# Patient Record
Sex: Female | Born: 1935 | Race: White | Hispanic: No | Marital: Married
Health system: Southern US, Community
[De-identification: ages and names within clinical notes are randomized; demographics above are authoritative.]

---

## 2004-06-29 ENCOUNTER — Ambulatory Visit: Payer: Self-pay | Admitting: Internal Medicine

## 2004-07-23 ENCOUNTER — Ambulatory Visit: Payer: Self-pay | Admitting: Internal Medicine

## 2004-09-22 ENCOUNTER — Ambulatory Visit: Payer: Self-pay | Admitting: Internal Medicine

## 2004-09-30 ENCOUNTER — Ambulatory Visit: Payer: Self-pay | Admitting: Internal Medicine

## 2004-10-21 ENCOUNTER — Ambulatory Visit: Payer: Self-pay | Admitting: Internal Medicine

## 2004-12-29 ENCOUNTER — Ambulatory Visit: Payer: Self-pay | Admitting: Internal Medicine

## 2005-01-04 ENCOUNTER — Ambulatory Visit: Payer: Self-pay | Admitting: Internal Medicine

## 2005-03-02 ENCOUNTER — Ambulatory Visit: Payer: Self-pay | Admitting: Ophthalmology

## 2005-03-08 ENCOUNTER — Ambulatory Visit: Payer: Self-pay | Admitting: Ophthalmology

## 2005-04-05 ENCOUNTER — Ambulatory Visit: Payer: Self-pay | Admitting: Internal Medicine

## 2005-04-12 ENCOUNTER — Ambulatory Visit: Payer: Self-pay | Admitting: Ophthalmology

## 2005-04-23 ENCOUNTER — Ambulatory Visit: Payer: Self-pay | Admitting: Internal Medicine

## 2005-08-04 ENCOUNTER — Ambulatory Visit: Payer: Self-pay | Admitting: Internal Medicine

## 2005-08-23 ENCOUNTER — Ambulatory Visit: Payer: Self-pay | Admitting: Internal Medicine

## 2005-10-14 ENCOUNTER — Ambulatory Visit: Payer: Self-pay | Admitting: Internal Medicine

## 2005-12-10 ENCOUNTER — Ambulatory Visit: Payer: Self-pay | Admitting: Internal Medicine

## 2005-12-21 ENCOUNTER — Ambulatory Visit: Payer: Self-pay | Admitting: Internal Medicine

## 2006-03-01 ENCOUNTER — Other Ambulatory Visit: Payer: Self-pay

## 2006-03-01 ENCOUNTER — Emergency Department: Payer: Self-pay | Admitting: Emergency Medicine

## 2006-04-08 ENCOUNTER — Ambulatory Visit: Payer: Self-pay | Admitting: Internal Medicine

## 2006-04-23 ENCOUNTER — Ambulatory Visit: Payer: Self-pay | Admitting: Internal Medicine

## 2006-10-07 ENCOUNTER — Ambulatory Visit: Payer: Self-pay | Admitting: Internal Medicine

## 2006-10-22 ENCOUNTER — Ambulatory Visit: Payer: Self-pay | Admitting: Internal Medicine

## 2006-11-29 ENCOUNTER — Ambulatory Visit: Payer: Self-pay | Admitting: Internal Medicine

## 2007-02-21 ENCOUNTER — Ambulatory Visit: Payer: Self-pay | Admitting: Internal Medicine

## 2007-03-10 ENCOUNTER — Ambulatory Visit: Payer: Self-pay | Admitting: Internal Medicine

## 2007-03-24 ENCOUNTER — Ambulatory Visit: Payer: Self-pay | Admitting: Internal Medicine

## 2007-07-06 ENCOUNTER — Ambulatory Visit: Payer: Self-pay | Admitting: Internal Medicine

## 2007-07-24 ENCOUNTER — Ambulatory Visit: Payer: Self-pay | Admitting: Internal Medicine

## 2007-10-22 ENCOUNTER — Ambulatory Visit: Payer: Self-pay | Admitting: Internal Medicine

## 2007-11-07 ENCOUNTER — Ambulatory Visit: Payer: Self-pay | Admitting: Internal Medicine

## 2007-11-22 ENCOUNTER — Ambulatory Visit: Payer: Self-pay | Admitting: Internal Medicine

## 2007-12-13 ENCOUNTER — Ambulatory Visit: Payer: Self-pay | Admitting: Internal Medicine

## 2008-03-13 ENCOUNTER — Emergency Department: Payer: Self-pay | Admitting: Emergency Medicine

## 2008-03-15 ENCOUNTER — Ambulatory Visit: Payer: Self-pay | Admitting: Emergency Medicine

## 2008-03-21 ENCOUNTER — Emergency Department: Payer: Self-pay

## 2008-05-06 ENCOUNTER — Ambulatory Visit: Payer: Self-pay | Admitting: Internal Medicine

## 2008-05-23 ENCOUNTER — Ambulatory Visit: Payer: Self-pay | Admitting: Internal Medicine

## 2008-09-10 ENCOUNTER — Ambulatory Visit: Payer: Self-pay | Admitting: Internal Medicine

## 2008-10-21 ENCOUNTER — Ambulatory Visit: Payer: Self-pay | Admitting: Internal Medicine

## 2008-11-04 ENCOUNTER — Ambulatory Visit: Payer: Self-pay | Admitting: Internal Medicine

## 2008-11-21 ENCOUNTER — Ambulatory Visit: Payer: Self-pay | Admitting: Internal Medicine

## 2009-02-05 ENCOUNTER — Ambulatory Visit: Payer: Self-pay | Admitting: Internal Medicine

## 2009-05-05 ENCOUNTER — Ambulatory Visit: Payer: Self-pay | Admitting: Internal Medicine

## 2009-05-23 ENCOUNTER — Ambulatory Visit: Payer: Self-pay | Admitting: Internal Medicine

## 2009-10-01 ENCOUNTER — Ambulatory Visit: Payer: Self-pay | Admitting: Anesthesiology

## 2009-10-08 ENCOUNTER — Ambulatory Visit: Payer: Self-pay | Admitting: Internal Medicine

## 2009-10-21 ENCOUNTER — Ambulatory Visit: Payer: Self-pay | Admitting: Internal Medicine

## 2009-10-22 ENCOUNTER — Ambulatory Visit: Payer: Self-pay | Admitting: Anesthesiology

## 2009-11-03 ENCOUNTER — Ambulatory Visit: Payer: Self-pay | Admitting: Internal Medicine

## 2009-11-21 ENCOUNTER — Ambulatory Visit: Payer: Self-pay | Admitting: Internal Medicine

## 2009-11-25 ENCOUNTER — Ambulatory Visit: Payer: Self-pay | Admitting: Anesthesiology

## 2009-12-24 ENCOUNTER — Ambulatory Visit: Payer: Self-pay | Admitting: Anesthesiology

## 2010-01-21 ENCOUNTER — Ambulatory Visit: Payer: Self-pay | Admitting: Internal Medicine

## 2010-02-03 ENCOUNTER — Ambulatory Visit: Payer: Self-pay | Admitting: Internal Medicine

## 2010-02-10 ENCOUNTER — Ambulatory Visit: Payer: Self-pay | Admitting: Internal Medicine

## 2010-02-19 ENCOUNTER — Ambulatory Visit: Payer: Self-pay | Admitting: Anesthesiology

## 2010-02-20 ENCOUNTER — Ambulatory Visit: Payer: Self-pay | Admitting: Internal Medicine

## 2010-04-17 ENCOUNTER — Ambulatory Visit: Payer: Self-pay | Admitting: Anesthesiology

## 2010-08-05 ENCOUNTER — Ambulatory Visit: Payer: Self-pay | Admitting: Internal Medicine

## 2010-08-07 LAB — CEA: CEA: 5.3 ng/mL — ABNORMAL HIGH (ref 0.0–4.7)

## 2010-08-23 ENCOUNTER — Ambulatory Visit: Payer: Self-pay | Admitting: Internal Medicine

## 2010-09-11 ENCOUNTER — Ambulatory Visit: Payer: Self-pay | Admitting: Anesthesiology

## 2011-02-03 ENCOUNTER — Ambulatory Visit: Payer: Self-pay | Admitting: Internal Medicine

## 2011-02-04 LAB — CEA: CEA: 4.3 ng/mL (ref 0.0–4.7)

## 2011-02-21 ENCOUNTER — Ambulatory Visit: Payer: Self-pay | Admitting: Internal Medicine

## 2011-03-09 ENCOUNTER — Ambulatory Visit: Payer: Self-pay | Admitting: Internal Medicine

## 2012-02-27 ENCOUNTER — Emergency Department: Payer: Self-pay | Admitting: *Deleted

## 2012-02-27 LAB — COMPREHENSIVE METABOLIC PANEL WITH GFR
Albumin: 4 g/dL
Alkaline Phosphatase: 82 U/L
Anion Gap: 9
BUN: 17 mg/dL
Bilirubin,Total: 0.4 mg/dL
Calcium, Total: 8.9 mg/dL
Chloride: 102 mmol/L
Co2: 28 mmol/L
Creatinine: 0.7 mg/dL
EGFR (African American): >60
EGFR (Non-African Amer.): >60
Glucose: 100 mg/dL — ABNORMAL HIGH
Osmolality: 279
Potassium: 3.4 mmol/L — ABNORMAL LOW
SGOT(AST): 25 U/L
SGPT (ALT): 16 U/L
Sodium: 139 mmol/L
Total Protein: 8.2 g/dL

## 2012-02-27 LAB — TROPONIN I: Troponin-I: 0.02 ng/mL

## 2012-02-27 LAB — CBC
HCT: 39.8 %
HGB: 13.5 g/dL
MCH: 33.6 pg
MCHC: 33.8 g/dL
MCV: 99 fL
Platelet: 189 x10 3/mm 3
RBC: 4.01 X10 6/mm 3
RDW: 12 %
WBC: 8.3 x10 3/mm 3

## 2012-02-27 LAB — URINALYSIS, COMPLETE
Blood: NEGATIVE
Glucose,UR: NEGATIVE mg/dL (ref 0–75)
Nitrite: NEGATIVE
Ph: 7 (ref 4.5–8.0)
Protein: NEGATIVE
RBC,UR: 11 /HPF (ref 0–5)
Specific Gravity: 1.018 (ref 1.003–1.030)
Squamous Epithelial: NONE SEEN

## 2012-02-27 LAB — CK TOTAL AND CKMB (NOT AT ARMC): CK, Total: 71 U/L (ref 21–215)

## 2012-10-25 ENCOUNTER — Emergency Department: Payer: Self-pay | Admitting: Emergency Medicine

## 2012-10-25 LAB — CBC WITH DIFFERENTIAL/PLATELET
Eosinophil #: 0.4 10*3/uL (ref 0.0–0.7)
Eosinophil %: 3.8 %
HCT: 37 % (ref 35.0–47.0)
Lymphocyte %: 11.9 %
MCH: 32.8 pg (ref 26.0–34.0)
MCHC: 33.3 g/dL (ref 32.0–36.0)
MCV: 98 fL (ref 80–100)
Monocyte #: 1.2 x10 3/mm — ABNORMAL HIGH (ref 0.2–0.9)
Monocyte %: 11.2 %
Neutrophil #: 7.6 10*3/uL — ABNORMAL HIGH (ref 1.4–6.5)
Neutrophil %: 72.8 %
Platelet: 180 10*3/uL (ref 150–440)
RBC: 3.76 10*6/uL — ABNORMAL LOW (ref 3.80–5.20)
RDW: 12.5 % (ref 11.5–14.5)
WBC: 10.5 10*3/uL (ref 3.6–11.0)

## 2012-10-25 LAB — URINALYSIS, COMPLETE
Bilirubin,UR: NEGATIVE
Blood: NEGATIVE
Ketone: NEGATIVE
Nitrite: NEGATIVE
Ph: 6 (ref 4.5–8.0)
Protein: NEGATIVE
RBC,UR: 1 /HPF (ref 0–5)
Specific Gravity: 1.015 (ref 1.003–1.030)
Squamous Epithelial: <1
WBC UR: NONE SEEN /HPF (ref 0–5)

## 2012-10-25 LAB — COMPREHENSIVE METABOLIC PANEL
Albumin: 3.3 g/dL — ABNORMAL LOW (ref 3.4–5.0)
BUN: 22 mg/dL — ABNORMAL HIGH (ref 7–18)
Bilirubin,Total: 0.4 mg/dL (ref 0.2–1.0)
Calcium, Total: 8.5 mg/dL (ref 8.5–10.1)
Co2: 33 mmol/L — ABNORMAL HIGH (ref 21–32)
Creatinine: 0.76 mg/dL (ref 0.60–1.30)
EGFR (African American): >60
Osmolality: 278 (ref 275–301)
Potassium: 4.1 mmol/L (ref 3.5–5.1)
SGOT(AST): 17 U/L (ref 15–37)
SGPT (ALT): 16 U/L (ref 12–78)
Total Protein: 7.6 g/dL (ref 6.4–8.2)

## 2013-02-20 ENCOUNTER — Ambulatory Visit: Payer: Self-pay | Admitting: Internal Medicine

## 2013-02-20 ENCOUNTER — Inpatient Hospital Stay: Payer: Self-pay | Admitting: Family Medicine

## 2013-02-20 LAB — COMPREHENSIVE METABOLIC PANEL
Albumin: 3.5 g/dL (ref 3.4–5.0)
Alkaline Phosphatase: 141 U/L — ABNORMAL HIGH (ref 50–136)
BUN: 18 mg/dL (ref 7–18)
Creatinine: 0.75 mg/dL (ref 0.60–1.30)
Glucose: 181 mg/dL — ABNORMAL HIGH (ref 65–99)
Osmolality: 278 (ref 275–301)
SGOT(AST): 41 U/L — ABNORMAL HIGH (ref 15–37)
SGPT (ALT): 26 U/L (ref 12–78)
Sodium: 136 mmol/L (ref 136–145)

## 2013-02-20 LAB — CBC WITH DIFFERENTIAL/PLATELET
Basophil #: 0.1 10*3/uL (ref 0.0–0.1)
Basophil %: 0.2 %
Eosinophil #: 0.6 10*3/uL (ref 0.0–0.7)
Eosinophil %: 2.7 %
Lymphocyte %: 45.9 %
MCH: 29.6 pg (ref 26.0–34.0)
Monocyte #: 1.4 x10 3/mm — ABNORMAL HIGH (ref 0.2–0.9)
Neutrophil #: 9.7 10*3/uL — ABNORMAL HIGH (ref 1.4–6.5)
Platelet: 233 10*3/uL (ref 150–440)
RBC: 4.54 10*6/uL (ref 3.80–5.20)
RDW: 16.1 % — ABNORMAL HIGH (ref 11.5–14.5)

## 2013-02-20 LAB — TSH: Thyroid Stimulating Horm: 3.78 u[IU]/mL

## 2013-03-23 ENCOUNTER — Ambulatory Visit: Payer: Self-pay | Admitting: Internal Medicine

## 2013-03-23 DEATH — deceased

## 2013-07-22 IMAGING — CT CT HEAD WITHOUT CONTRAST
2 series · 15 of 30 positions shown, 19 images · non-contrast
Comparison: none

REASON FOR EXAM: AMS
COMMENTS:

PROCEDURE:     CT  - CT HEAD WITHOUT CONTRAST  - February 27, 2012  [DATE]
RESULT:     Comparison:  03/13/2008
TECHNIQUE: Multiple axial images from the foramen magnum to the vertex were
obtained without IV contrast.

[Series 2: without · axial · non-contrast · 0.44mm/px · z∈[-15,+120]mm · 13 of 33 slices shown, 17 images]
[im 3/33  brain]
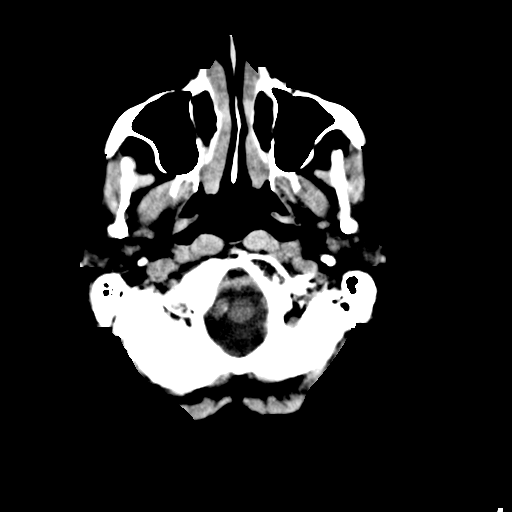
[im 3/33  bone]
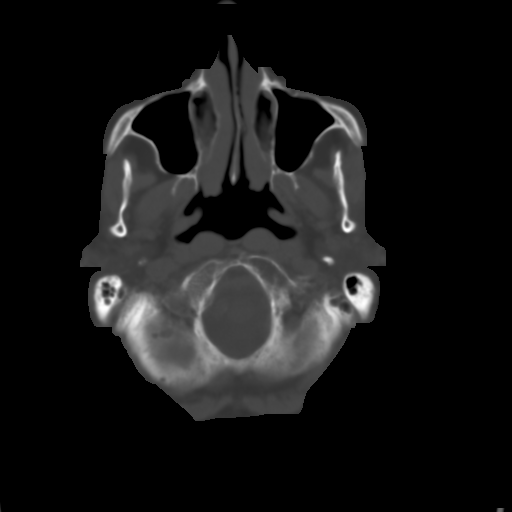
[im 5/33  brain]
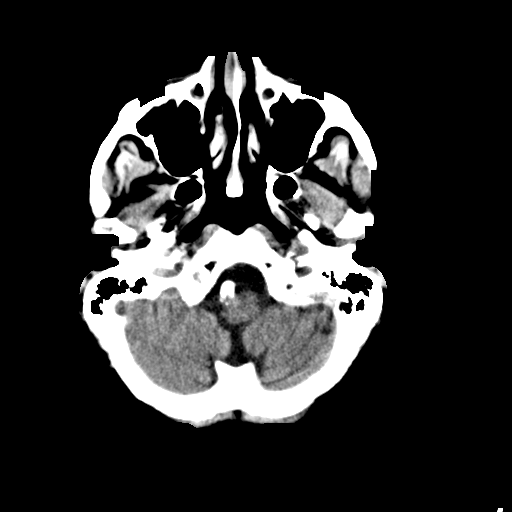
[im 7/33  brain]
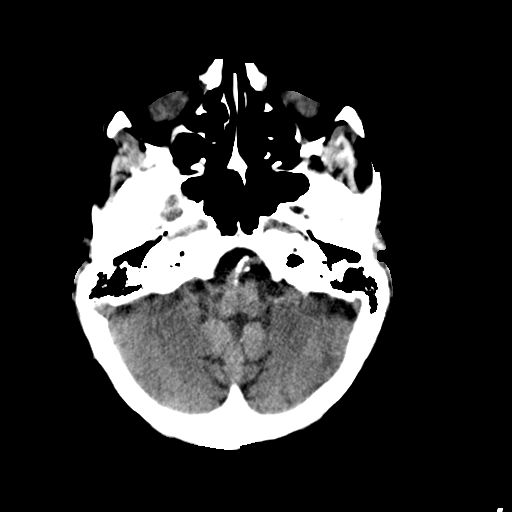
[im 10/33  brain]
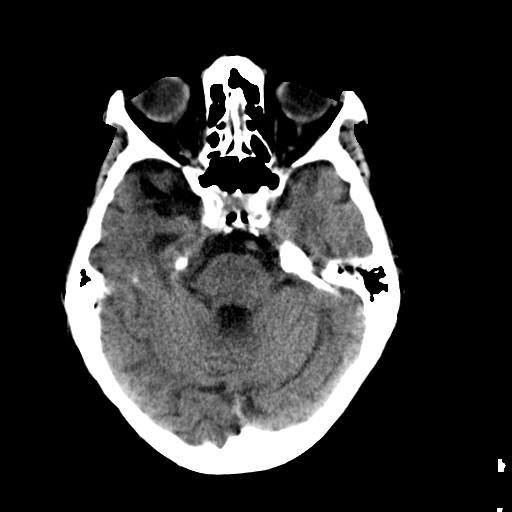
[im 12/33  brain]
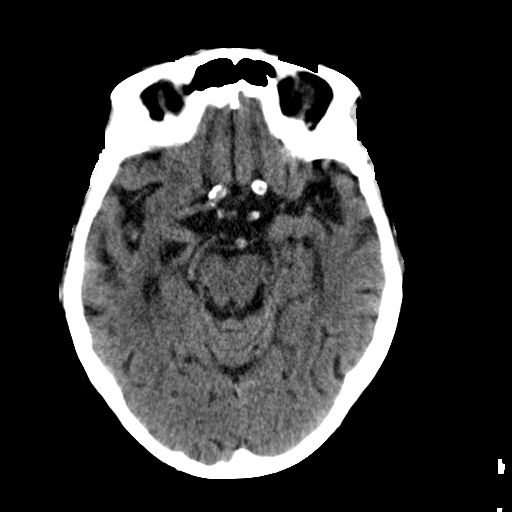
[im 12/33  bone]
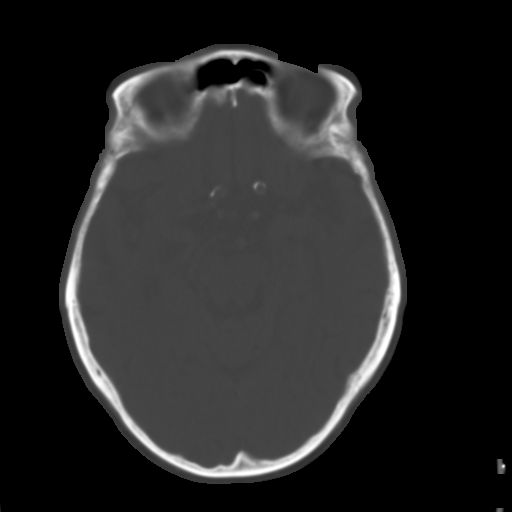
[im 14/33  brain]
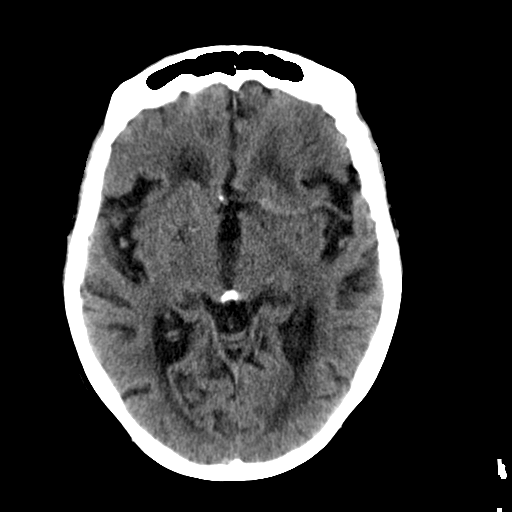
[im 17/33  brain]
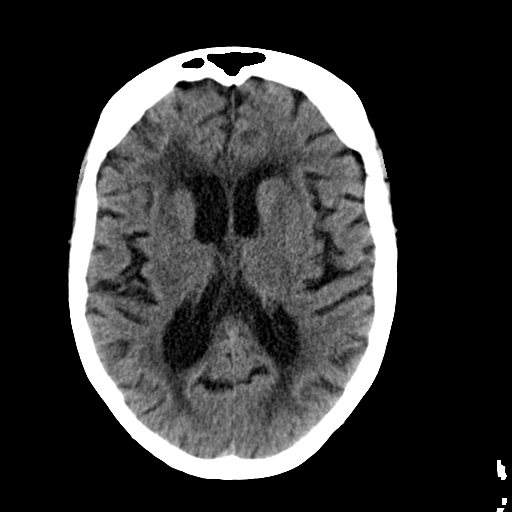
[im 19/33  brain]
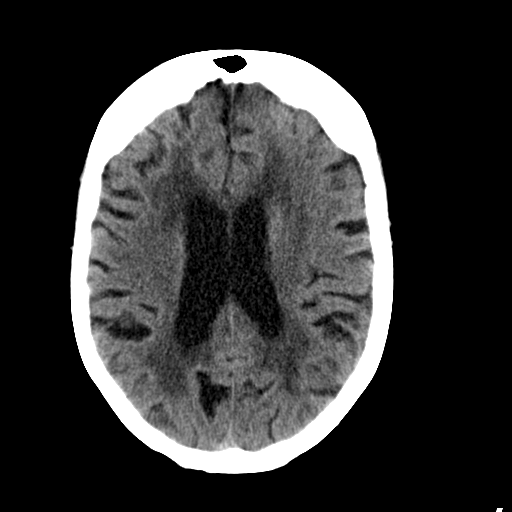
[im 21/33  brain]
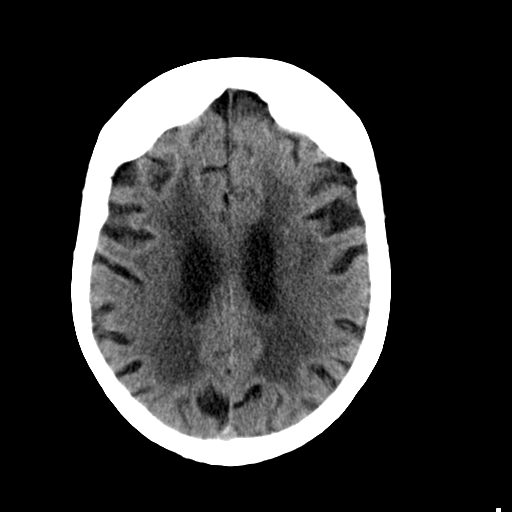
[im 21/33  bone]
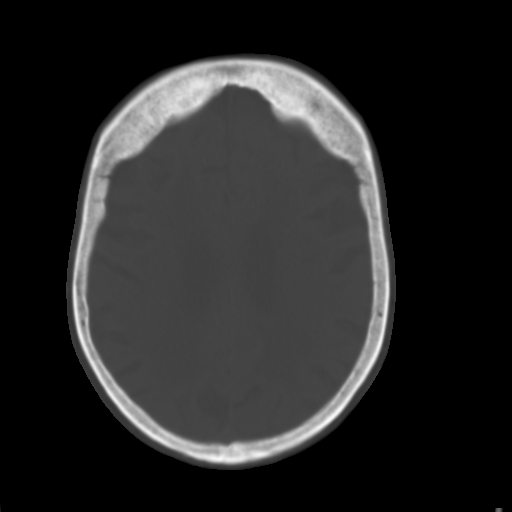
[im 23/33  brain]
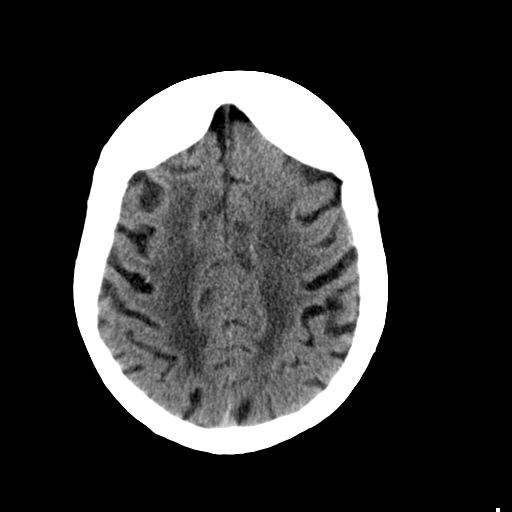
[im 26/33  brain]
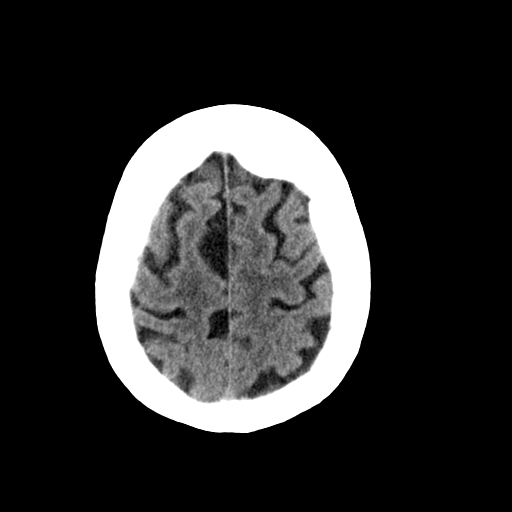
[im 28/33  brain]
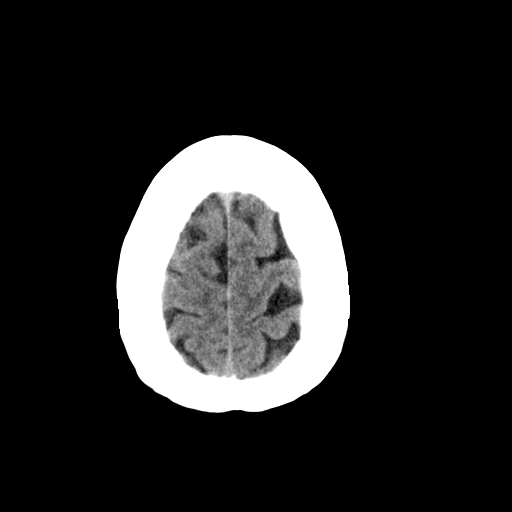
[im 30/33  brain]
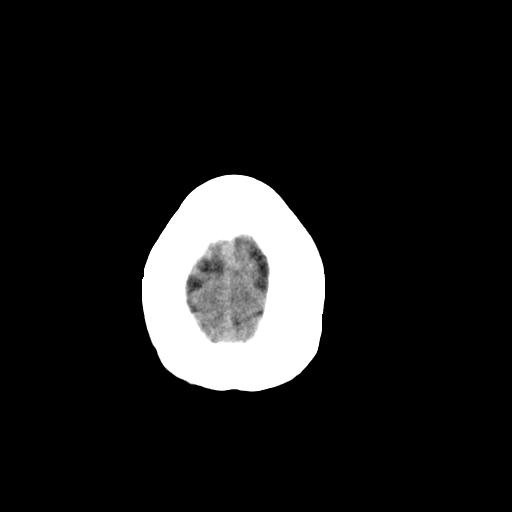
[im 30/33  bone]
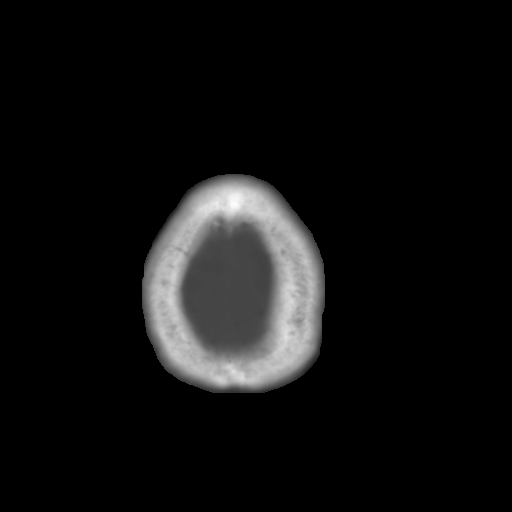

[Series 3: bone · axial · 0.44mm/px · z∈[-15,+5]mm · 2 of 33 slices shown]
[im 3/33  bone]
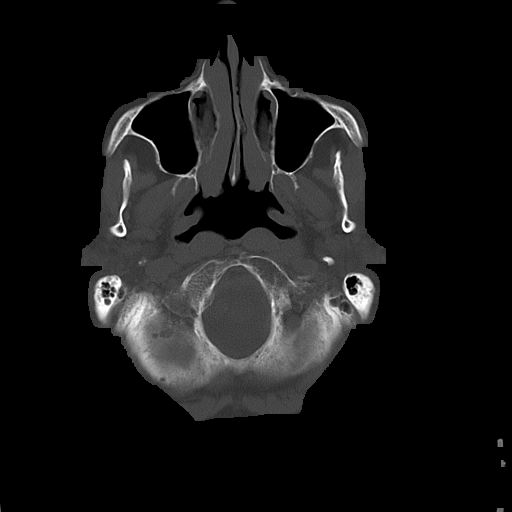
[im 7/33  bone]
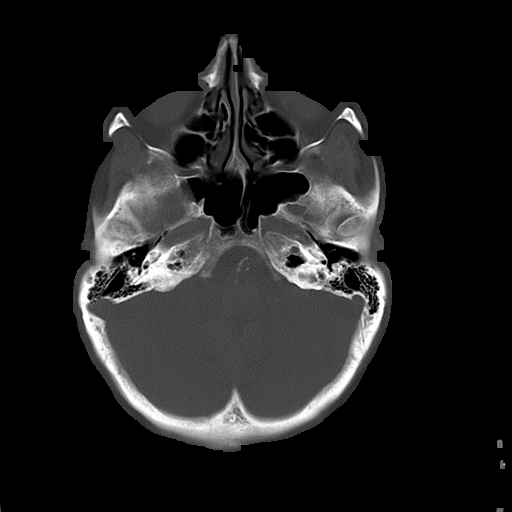

[15 of 30 positions shown; findings below may reference images not displayed]

FINDINGS: There is no evidence of mass effect, midline shift, or extra-axial fluid
collections.  There is no evidence of a space-occupying lesion or
intracranial hemorrhage. There is no evidence of a cortical-based area of
acute infarction. There is an old left thalamic lacunar infarct. There is
generalized cerebral atrophy. There is periventricular white matter low
attenuation likely secondary to microangiopathy.

The ventricles and sulci are appropriate for the patient's age. The basal
cisterns are patent.

Visualized portions of the orbits are unremarkable. The visualized portions
of the paranasal sinuses and mastoid air cells are unremarkable.
Cerebrovascular atherosclerotic calcifications are noted.

The osseous structures are unremarkable.
IMPRESSION: No acute intracranial process.

[REDACTED]

## 2014-03-20 IMAGING — CR DG KNEE COMPLETE 4+V*L*
1 series · 4 of 4 positions shown · non-contrast
Comparison: none

REASON FOR EXAM: fall with left knee pain, deformity
COMMENTS:

PROCEDURE:     DXR - DXR KNEE LT COMP WITH OBLIQUES  - October 25, 2012  [DATE]
RESULT:     Comparison:  None

[Series 3: x knee ap left · 0.14mm/px · 4 of 4 slices shown]
[im 1/4]
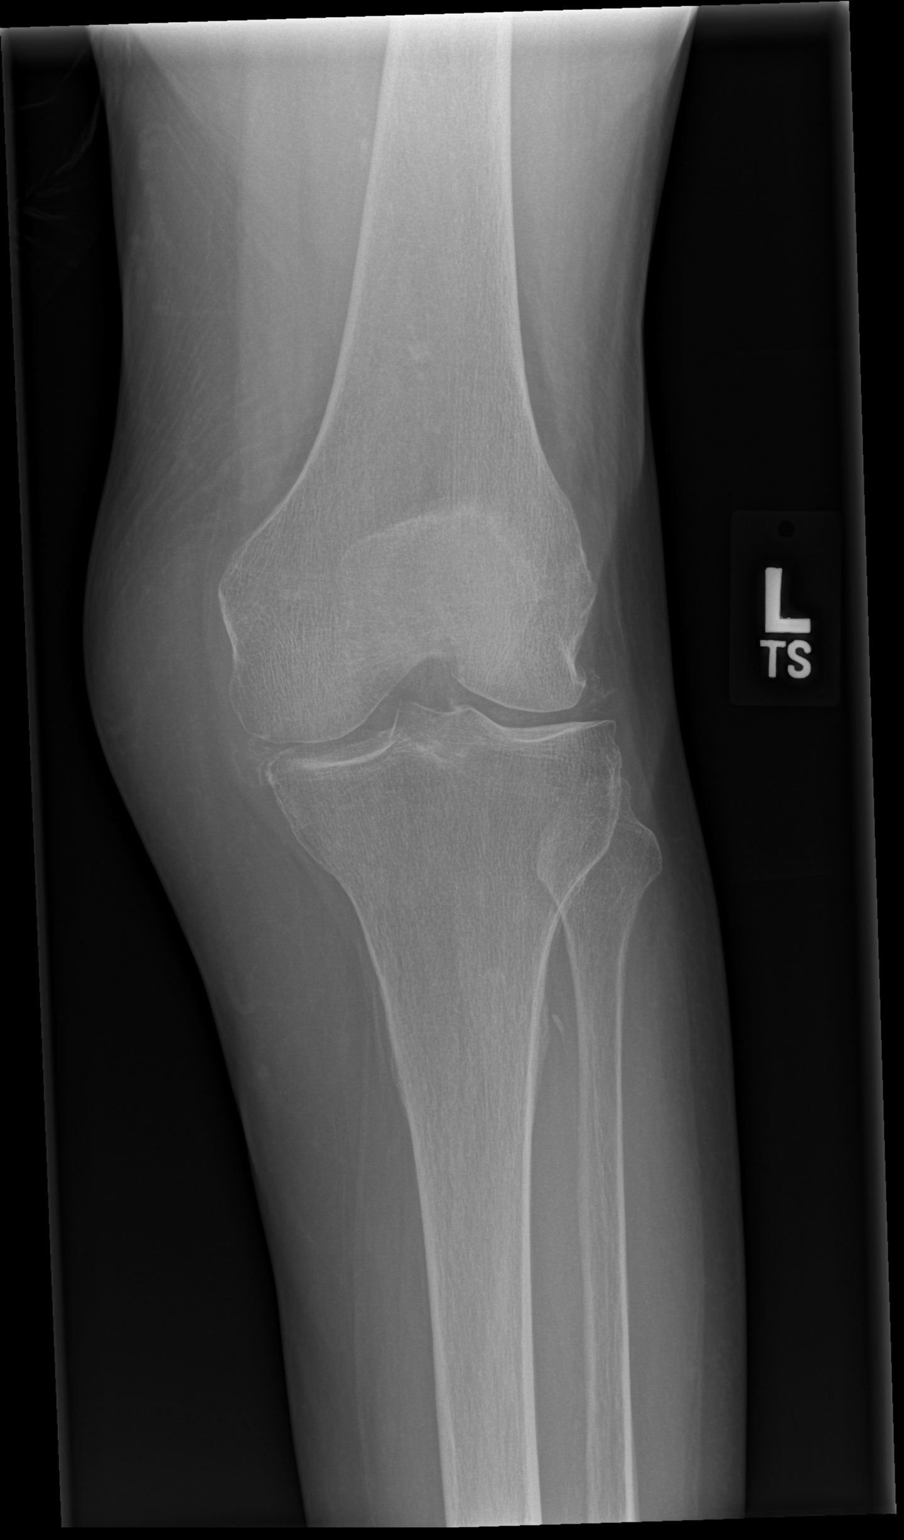
[im 2/4]
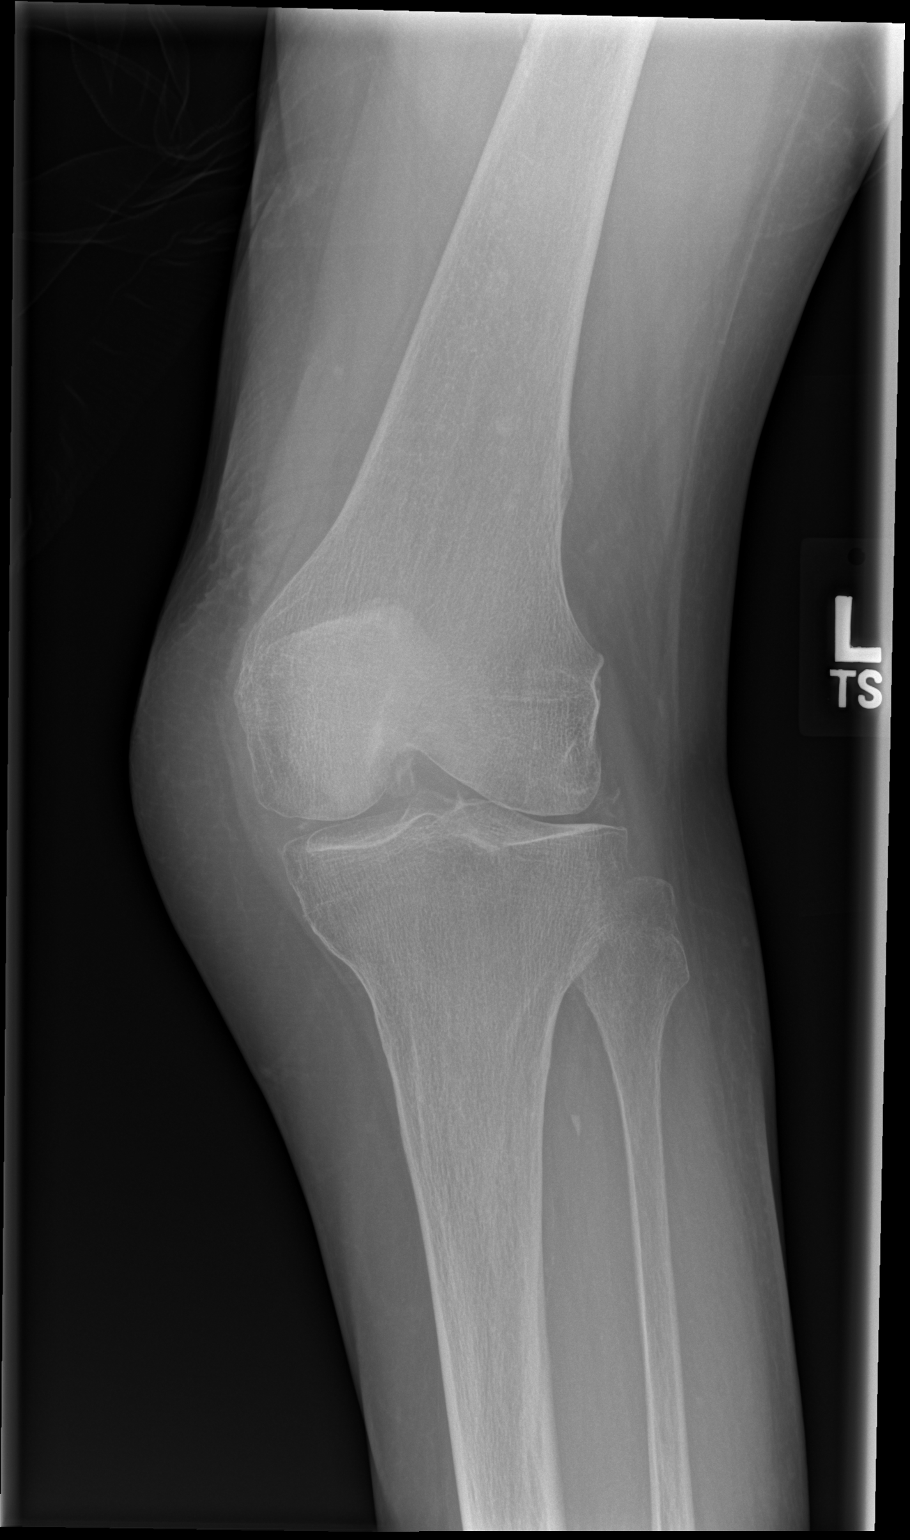
[im 3/4]
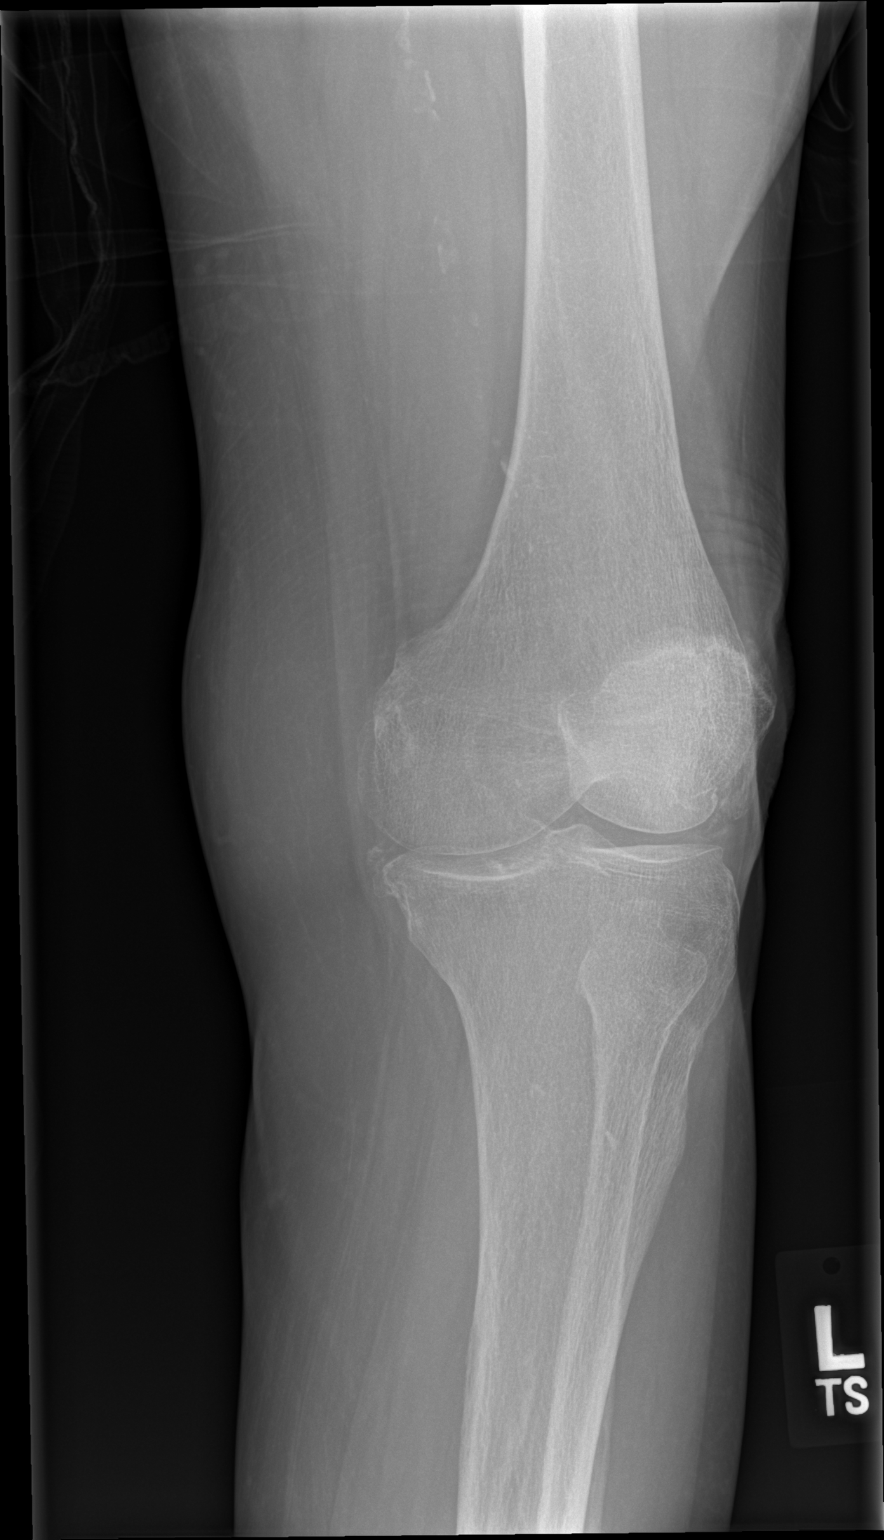
[im 4/4]
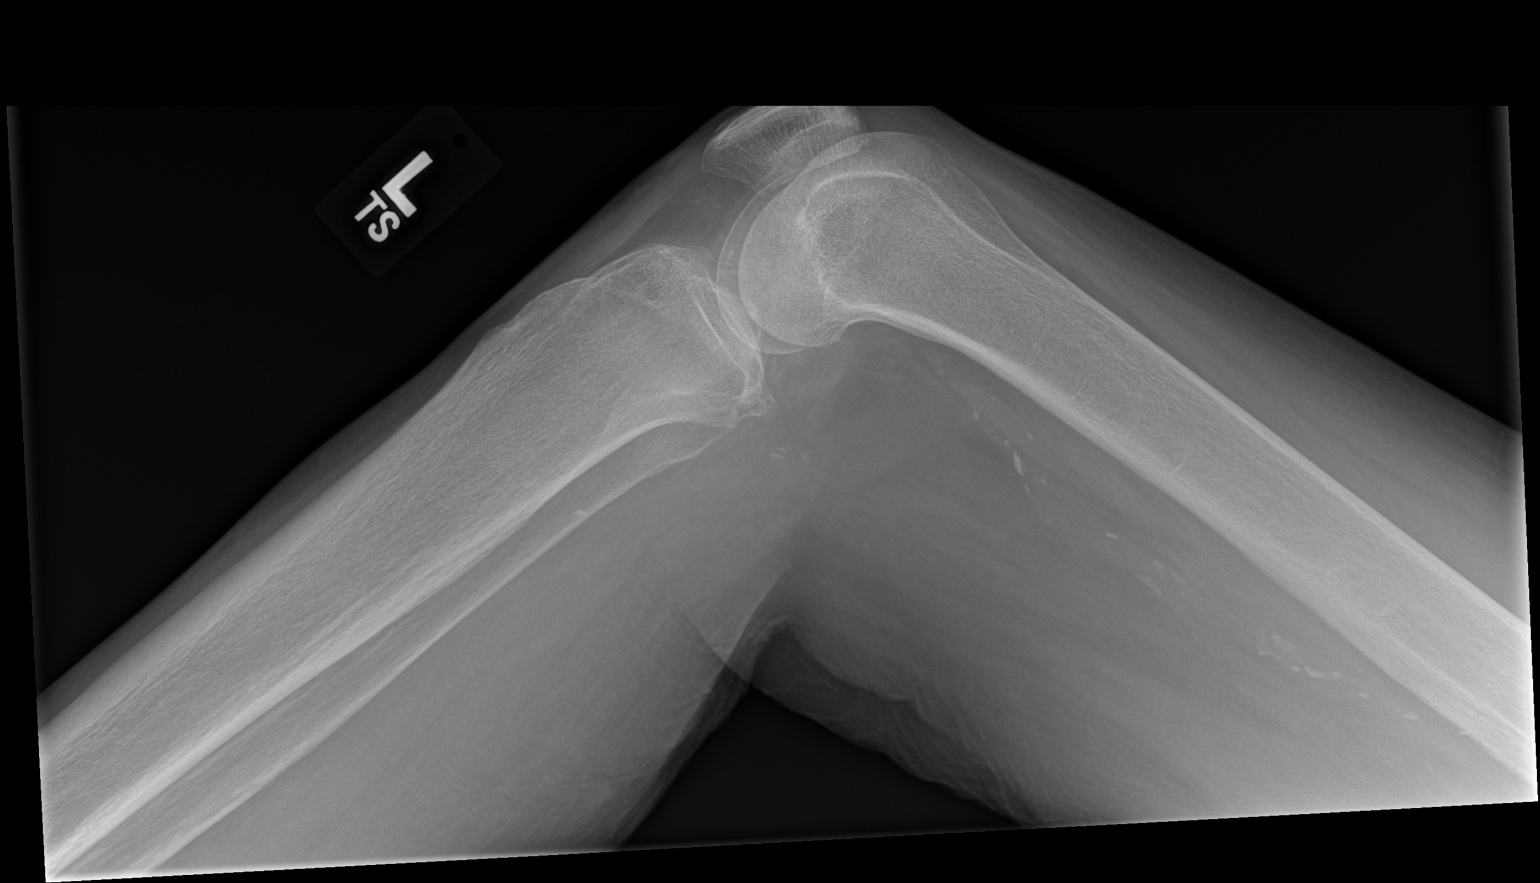

[4 of 4 positions shown; findings below may reference images not displayed]

FINDINGS: 4 views of the left knee demonstrates no acute fracture or dislocation.
There is no significant joint effusion. There is chondrocalcinosis of the
medial and lateral tibiofemoral compartment as can be seen with CPPD.
IMPRESSION: No acute osseous injury of the left knee.

[REDACTED]

## 2014-03-20 IMAGING — CR DG ANKLE COMPLETE 3+V*L*
1 series · 4 of 4 positions shown · non-contrast
Comparison: none

REASON FOR EXAM: pain sp fall
COMMENTS:

[Series 7: x ankle lat left · 0.14mm/px · 4 of 4 slices shown]
[im 1/4]
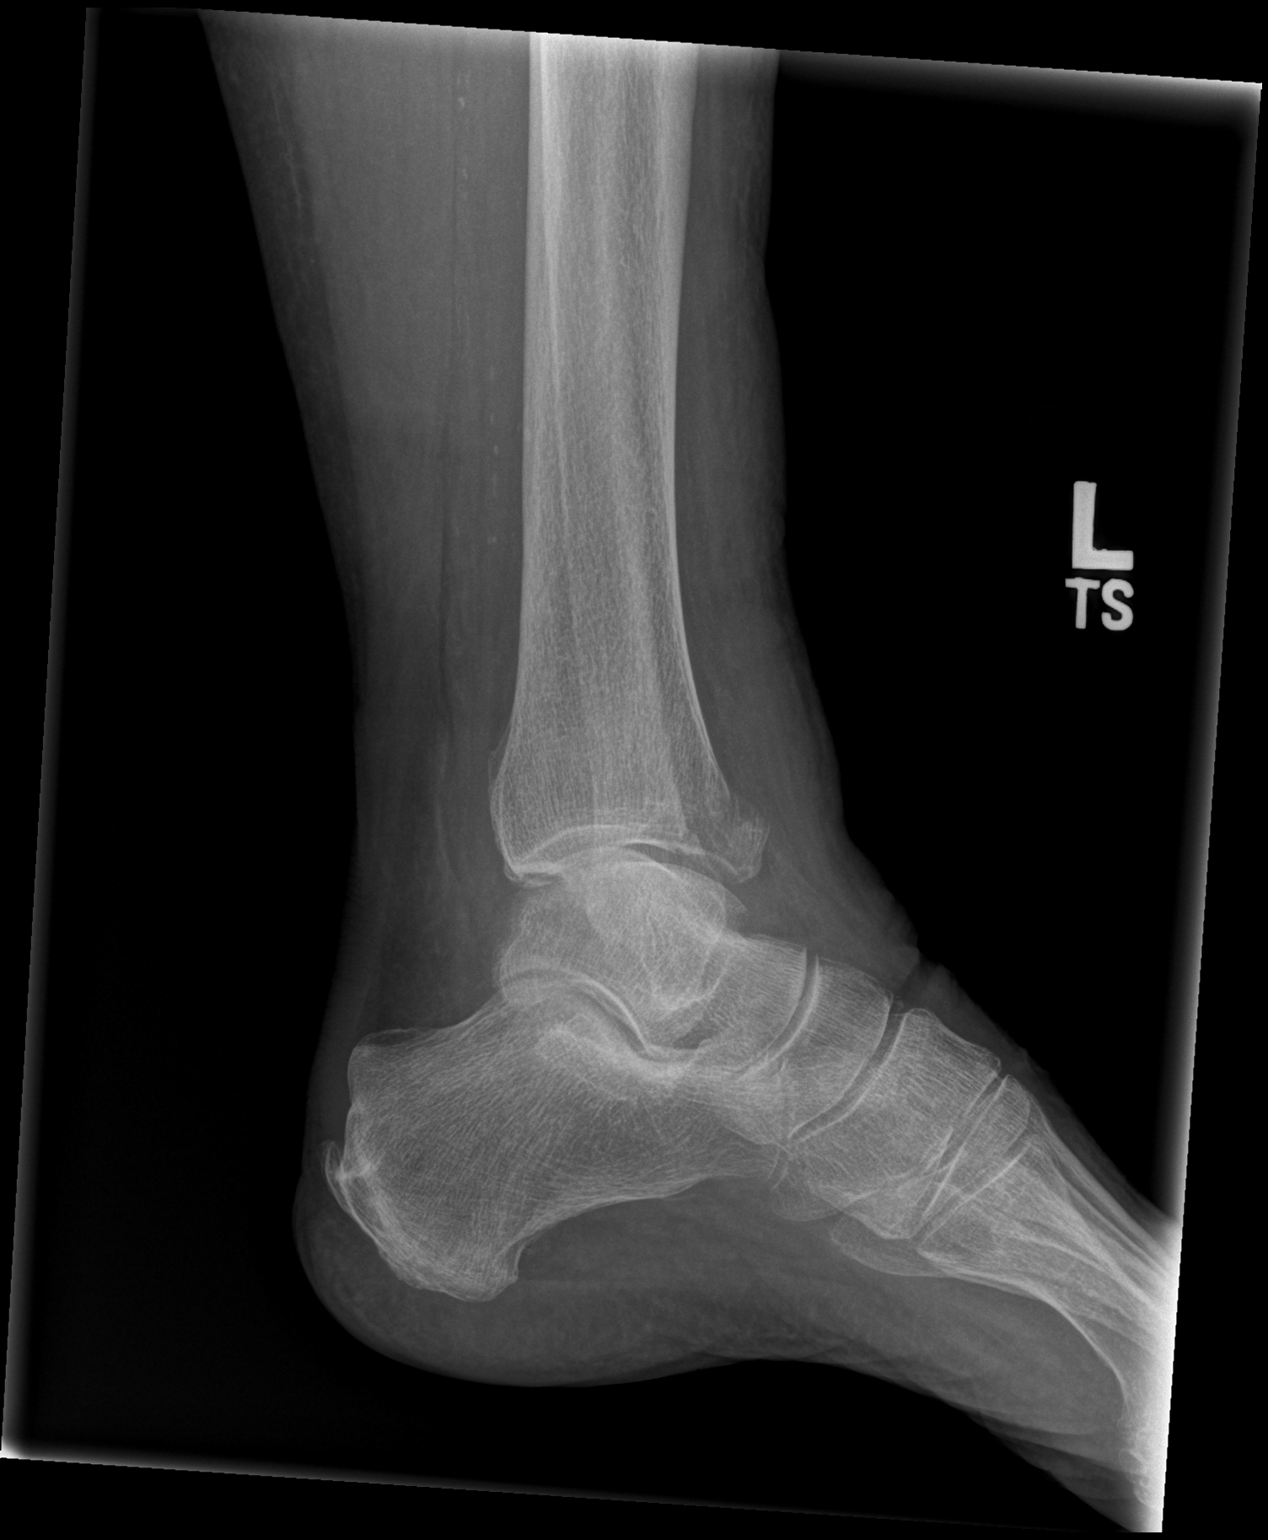
[im 2/4]
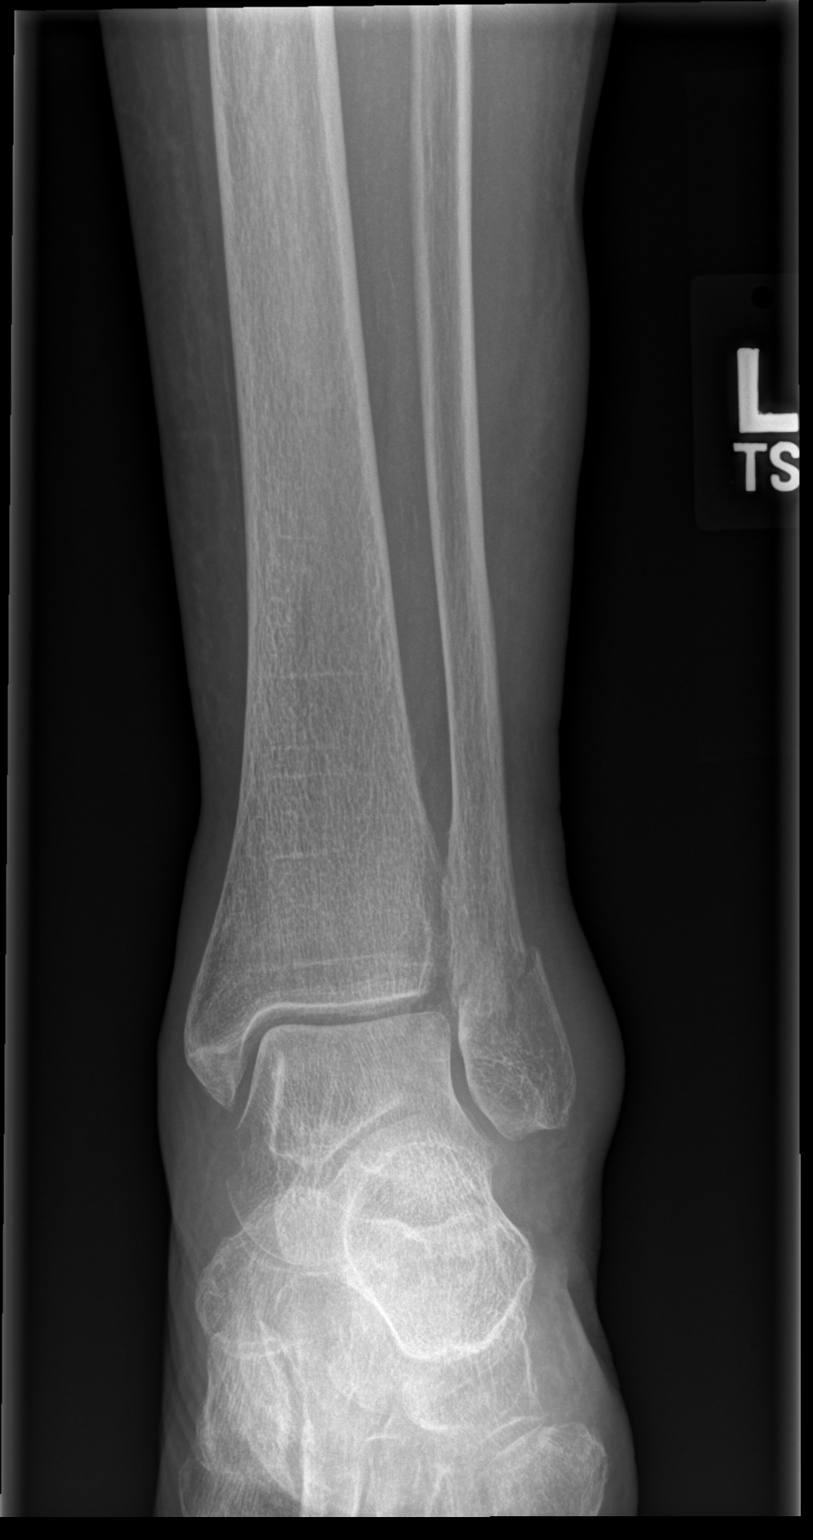
[im 3/4]
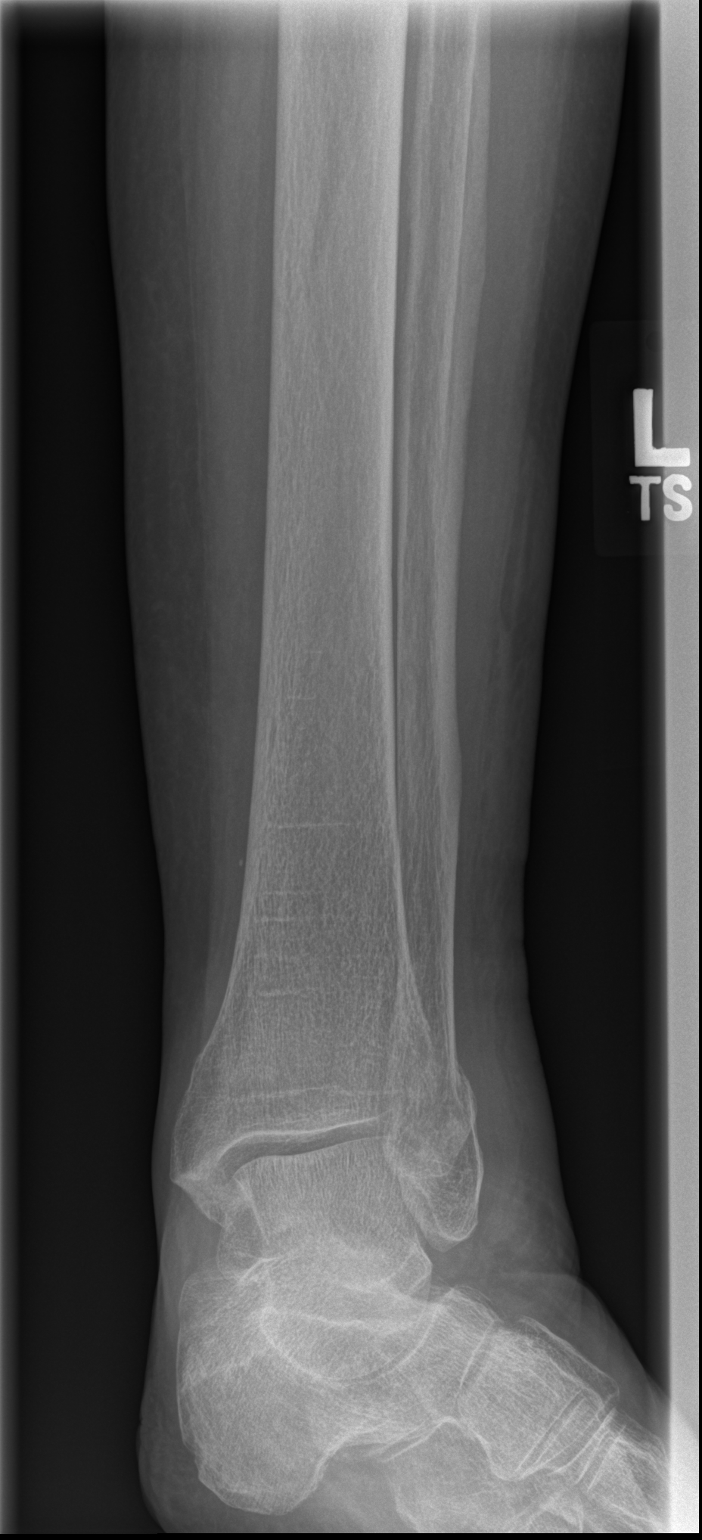
[im 4/4]
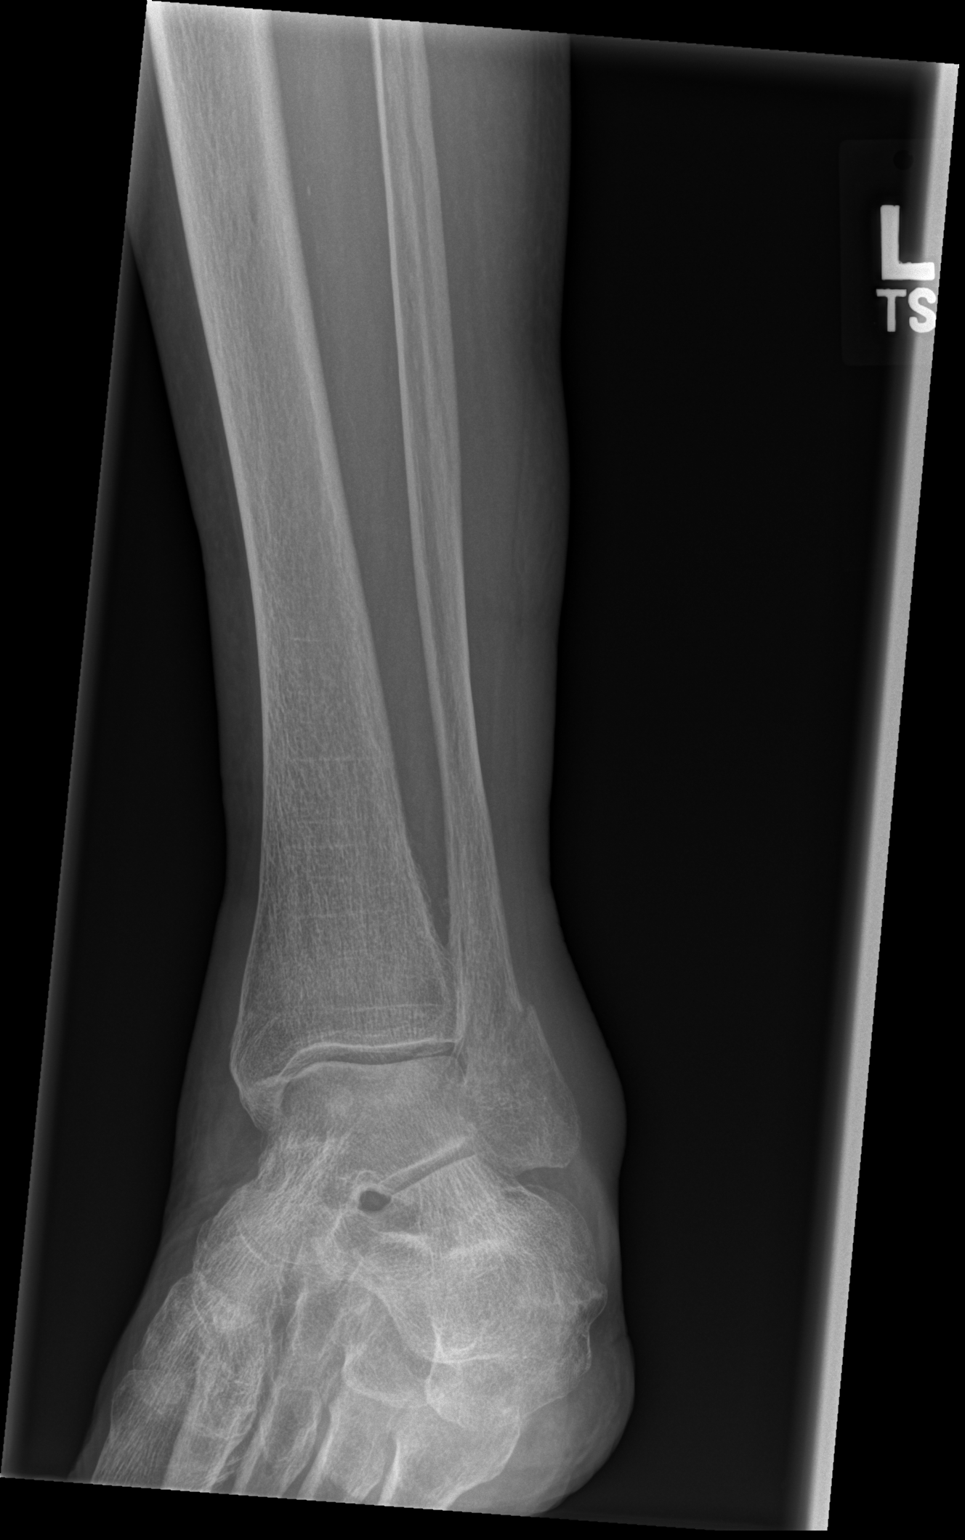

[4 of 4 positions shown; findings below may reference images not displayed]

PROCEDURE:     DXR - DXR ANKLE LEFT COMPLETE  - October 25, 2012  [DATE]

RESULT:     Images of the left ankle demonstrate a fracture in the distal
portion of the left fibula and possible nondisplaced fracture in the
anterior distal tibia. The medial malleolus appears to be intact the
posterior malleolus appears unremarkable.
IMPRESSION: Left ankle fracture as described.

[REDACTED]

## 2014-07-16 IMAGING — CR DG CHEST 1V PORT
1 series · 1 of 1 positions shown · non-contrast
Comparison: none

REASON FOR EXAM: unresponsive
COMMENTS:

PROCEDURE:     DXR - DXR PORTABLE CHEST SINGLE VIEW  - February 20, 2013  [DATE]
RESULT:     The lungs are adequately inflated. Interstitial markings are
increased. The pulmonary vascularity is engorged. The cardiac silhouette is
enlarged. There is no pleural effusion.

[ap]
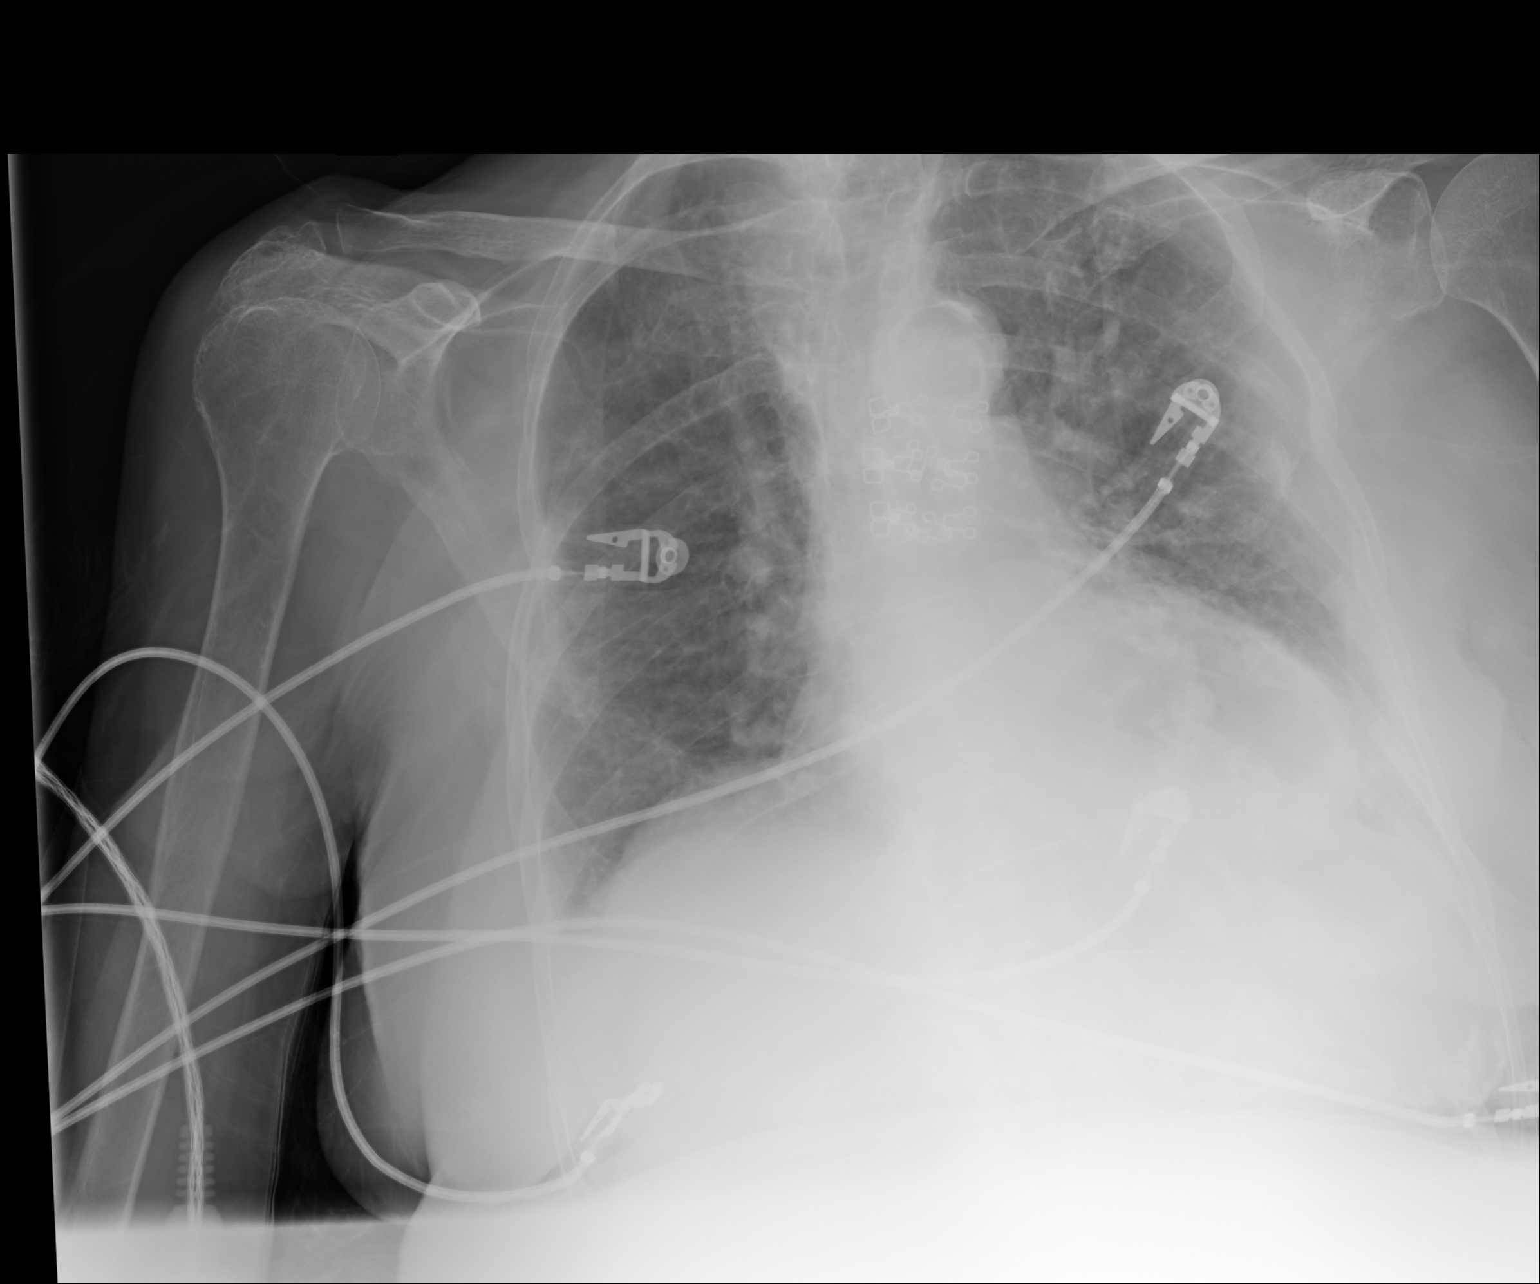

[1 of 1 positions shown; findings below may reference images not displayed]

IMPRESSION: The findings are consistent with CHF with mild pulmonary
edema.

[REDACTED]

## 2014-12-13 NOTE — H&P (Signed)
PATIENT NAME:  Valerie Moody, SOLIDAY MR#:  [id number] DATE OF BIRTH:  1916-08-03  DATE OF ADMISSION:  02/20/2013  PRIMARY CARE PHYSICIAN: Dr. Burnadette Pop.  HISTORY OF PRESENT ILLNESS:  The patient is a 81 year old Caucasian female who presented via EMS from [facility] after she was resuscitated despite her DNR order. According to a family member as well as medical records, the patient was in [facility]. She was eating lunch and suddenly she fell down across the table. When the nursing staff checked on her, she was able to breathe; however, there was no pulse so CPR was started and the patient was brought back to life. She was brought by EMS with nonrebreather. In the Emergency Room, she was noted to have Cheyne-Stokes breathing as well as severe hypertension with systolic blood pressure in the 190s. She was also tachycardic with heart rate around 120s to 130s with some arrhythmias and PVCs. Just a day ago she was walking with a walker and did not complain of any significant discomfort. When a CT scan of her head was done, the patient was noted to have a severe subarachnoid hemorrhage in the cerebral base area as well as extension to lateral and third ventricles with possible foramen of Monro occlusion, but no hydrocephalus was noted. Hospitalist services were contacted for admission.   PAST MEDICAL HISTORY:  Significant for history of cecal adenocarcinoma status post right hemicolectomy in November 2003, history of internal hemorrhoids, diverticular as well as polyps in 2005, colonoscopy, history of grade 1 reflux esophagitis, hiatal hernia as well as normal duodenum on EGD in 2005, history of lung surgery due to bronchiectasis in the remote past, history of CVA and hemorrhagic stroke, according to the patient's family which affected her short-term memory, history of osteoarthritis in the lower back, status post herniated disk surgery at [facility] in the past,  depression, status post oophorectomy.     FAMILY HISTORY:  Significant for heart disease, as well as breast carcinoma, no history of colon cancer in the family.   SOCIAL HISTORY:  (Dictation Anomaly)No smoking. There was no alcohol abuse. She was fairly active as well as independent otherwise in the assisted living facility up until just now.   MEDICATIONS AT HOME:  Centrum Silver once daily, citalopram 20 mg daily, Norvasc 5 mg p.o. daily, Ocuvite once daily, Os-Cal once daily, Tylenol 500 mg every 8 hours as needed.   REVIEW OF SYSTEMS:  Not available as the patient is unconscious. She is comatose.   PHYSICAL EXAMINATION:  VITAL SIGNS: On arrival to the Emergency Room, the patient's, temperature is not measured, pulse is ranging from 102 to 146, respirations 14, blood pressure 271/94, saturation 96% on room air.  GENERAL: This is a well-nourished Caucasian female in moderate distress. She is holding her head upright. She has her mouth open and she breathes frequently as well as forcefully. She seemed to be having some stridor on inspiration.  HEENT: Her pupils are approximately 1 mm and nonreactive to light. Extraocular movements are not present. No icterus or conjunctivitis. Not able to assess her hearing. No pharyngeal erythema. Mucosa is dry.  NECK: No masses, supple and nontender. Thyroid is not enlarged. No adenopathy. No JVD or carotid bruits bilaterally. Full range of motion.  LUNGS: Clear to auscultation. Few rhonchi were heard bilaterally due to upper respiratory sounds. No diminished breath sounds or wheezing. The patient does have labored inspirations as well as increased effort to breathe. She is in  moderate respiratory distress.  CARDIOVASCULAR: S1, S2 appreciated. No murmurs, gallops or rubs. (Dictation Anomaly)Rythm is regular, tachycardic. PMI not lateralized. Chest is nontender to palpation.  EXTREMITIES: 1+ pedal pulses. No lower extremity edema, calf tenderness or cyanosis was  noted.  ABDOMEN: Soft, nontender. Bowel sounds are present. No hepatosplenomegaly or masses were noted.  RECTAL: Deferred.  MUSCLE STRENGTH: Not able to assess as the patient is comatose and not able to move her upper or lower extremities. No cyanosis. Degenerative joint disease was not assessed. Not able to assess for kyphosis. Gait is not tested.  SKIN: Did not reveal any rashes, lesions, erythema, nodularity or induration. It was warm and dry to palpation.  LYMPHATICS: No adenopathy in the cervical region.  NEUROLOGIC: Cranial nerves are not able to be assessed. Deep tendon reflexes are diminished. Not able to assess sensory. Not able to assess for dysarthria or aphasia. The patient is comatose. She is not responding to verbal or painful stimuli. She is not oriented, not cooperative. Not moving her upper or lower extremities.   LABORATORY DATA:  Glucose is 151, potassium 3.4, otherwise BMP is unremarkable. The patient's total protein is 8.5, alkaline phosphatase 141, AST 41, otherwise liver enzymes are unremarkable. Troponin is 0.02. TSH is normal at 3.78. White blood cell count is elevated to 21,700, hemoglobin 13.5, platelet count 273, absolute neutrophil count is 9.7.   RADIOLOGIC STUDIES:  Chest x-ray, portable single view, on February 20, 2013 reveals findings consistent with CHF as well as mild pulmonary edema. CT scan of head without contrast shows a large amount of subarachnoid hemorrhage. There is hemorrhage visible in the occipital horns bilaterally, right greater than the left. This is a benign hemorrhage within the third as well as fourth ventricles as well as suprasellar cistern. There is fullness of the foramen magnum concerning for increased cranial pressure, but no hydrocephalus. Osseous structures were unremarkable. A large amount of subarachnoid hemorrhage with the most amount of hemorrhage seen in the suprasellar cistern was noted.   ASSESSMENT AND PLAN:   1.  Subarachnoid hemorrhage.  The patient has severe neurologic impairment. This was discussed with patient's family extensively and the patient's son. He was advised to ask his family to stay during end of life care. The patient will be admitted to the hospital for comfort care measures. I discussed the case with Dr. Harvie JuniorPhifer, palliative care physician, who reached their son for hospice home; however, no beds are available at this time.  2.  Leukocytosis likely stress related. No intervention.  3.  Hypertension. We will start the patient on nitroglycerin topically.  4.  Tachycardia. Conservative management and supportive therapy only.   TIME SPENT:  1 hour.    ____________________________ Katharina Caperima Skylor Schnapp, MD rv:si D: 02/20/2013 16:29:00 ET T: 02/20/2013 17:35:18 ET JOB#: [id number]  cc: Marisue IvanKanhka Linthavong, MD Katharina Caperima Janique Hoefer, MD, <Dictator> Bria Sparr MD ELECTRONICALLY SIGNED 03/07/2013 6:38

## 2014-12-13 NOTE — Consult Note (Signed)
   Comments   Son now at bedside. Discussed transfer to the [facility]ospice Home with him and he is in agreement. Ginny Ward, RN, [facility] notified and will see pt. CM aware. Orders entered.  Dx: Subarachnoid hemorrhage  Secondary Dx: Dementia  Electronic Signatures: Cielo Arias, Harriett SineNancy (MD)  (Signed 02-Jul-14 10:13)  Authored: Palliative Care   Last Updated: 02-Jul-14 10:13 by Freeda Spivey, Harriett SineNancy (MD)
